# Patient Record
Sex: Female | Born: 1965
Health system: Southern US, Community
[De-identification: ages and names within clinical notes are randomized; demographics above are authoritative.]

---

## 2017-09-25 ENCOUNTER — Emergency Department (HOSPITAL_COMMUNITY): Payer: Self-pay

## 2017-09-25 ENCOUNTER — Emergency Department (HOSPITAL_COMMUNITY)
Admission: EM | Admit: 2017-09-25 | Discharge: 2017-09-25 | Disposition: A | Discharge status: Home / Self Care | Payer: Self-pay | Attending: Emergency Medicine | Admitting: Emergency Medicine

## 2017-09-25 DIAGNOSIS — Y92410 Unspecified street and highway as the place of occurrence of the external cause: Secondary | ICD-10-CM | POA: Insufficient documentation

## 2017-09-25 DIAGNOSIS — Y999 Unspecified external cause status: Secondary | ICD-10-CM | POA: Insufficient documentation

## 2017-09-25 DIAGNOSIS — M25552 Pain in left hip: Secondary | ICD-10-CM | POA: Insufficient documentation

## 2017-09-25 DIAGNOSIS — S39012A Strain of muscle, fascia and tendon of lower back, initial encounter: Secondary | ICD-10-CM | POA: Insufficient documentation

## 2017-09-25 DIAGNOSIS — Y9389 Activity, other specified: Secondary | ICD-10-CM | POA: Insufficient documentation

## 2017-09-25 DIAGNOSIS — S8001XA Contusion of right knee, initial encounter: Secondary | ICD-10-CM | POA: Insufficient documentation

## 2017-09-25 MED ORDER — IBUPROFEN 200 MG PO TABS
600.0000 mg | ORAL_TABLET | Freq: Once | ORAL | Status: AC
  Administered 2017-09-25: 600 mg via ORAL
  Filled 2017-09-25: qty 1

## 2017-09-25 MED ORDER — HYDROCODONE-ACETAMINOPHEN 5-325 MG PO TABS
2.0000 | ORAL_TABLET | Freq: Once | ORAL | Status: AC
Start: 2017-09-25 — End: 2017-09-25
  Administered 2017-09-25: 2 via ORAL
  Filled 2017-09-25: qty 2
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
## 2017-09-25 NOTE — ED Notes (Signed)
Patient ambulated independently to bathroom, gait steady, but limping.
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
## 2017-09-25 NOTE — Discharge Instructions (Addendum)
Use ice Tylenol and Motrin as needed for pain.  If you were given medicines take as directed.  If you are on coumadin or contraceptives realize their levels and effectiveness is altered by many different medicines.  If you have any reaction (rash, tongues swelling, other) to the medicines stop taking and see a physician.    If your blood pressure was elevated in the ER make sure you follow up for management with a primary doctor or return for chest pain, shortness of breath or stroke symptoms.  Please follow up as directed and return to the ER or see a physician for new or worsening symptoms.  Thank you. Vitals:   09/25/17 1720  BP: (!) 155/100  Pulse: 69  Resp: 16  Temp: (!) 97.4 F (36.3 C)  TempSrc: Oral  SpO2: 98%
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
